# Patient Record
Sex: Female | Born: 1980 | Race: Black or African American | Hispanic: No | Marital: Single | Smoking: Current every day smoker
Health system: Southern US, Community
[De-identification: ages and names within clinical notes are randomized; demographics above are authoritative.]

---

## 2018-08-09 ENCOUNTER — Ambulatory Visit (INDEPENDENT_AMBULATORY_CARE_PROVIDER_SITE_OTHER): Payer: Self-pay | Admitting: Otolaryngology

## 2018-09-10 ENCOUNTER — Ambulatory Visit (INDEPENDENT_AMBULATORY_CARE_PROVIDER_SITE_OTHER): Payer: Self-pay | Admitting: Otolaryngology

## 2019-11-05 ENCOUNTER — Emergency Department (HOSPITAL_COMMUNITY)
Admission: EM | Admit: 2019-11-05 | Discharge: 2019-11-05 | Disposition: A | Discharge status: Home / Self Care | Payer: Medicaid Other | Attending: Emergency Medicine | Admitting: Emergency Medicine

## 2019-11-05 ENCOUNTER — Encounter (HOSPITAL_COMMUNITY): Payer: Self-pay | Admitting: Emergency Medicine

## 2019-11-05 DIAGNOSIS — Y939 Activity, unspecified: Secondary | ICD-10-CM | POA: Diagnosis not present

## 2019-11-05 DIAGNOSIS — W01110A Fall on same level from slipping, tripping and stumbling with subsequent striking against sharp glass, initial encounter: Secondary | ICD-10-CM | POA: Insufficient documentation

## 2019-11-05 DIAGNOSIS — Z5321 Procedure and treatment not carried out due to patient leaving prior to being seen by health care provider: Secondary | ICD-10-CM | POA: Insufficient documentation

## 2019-11-05 DIAGNOSIS — Y929 Unspecified place or not applicable: Secondary | ICD-10-CM | POA: Insufficient documentation

## 2019-11-05 DIAGNOSIS — Y999 Unspecified external cause status: Secondary | ICD-10-CM | POA: Diagnosis not present

## 2019-11-05 DIAGNOSIS — S61511A Laceration without foreign body of right wrist, initial encounter: Secondary | ICD-10-CM | POA: Insufficient documentation

## 2019-11-05 DIAGNOSIS — S6991XA Unspecified injury of right wrist, hand and finger(s), initial encounter: Secondary | ICD-10-CM | POA: Diagnosis present
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
## 2020-01-21 ENCOUNTER — Emergency Department (HOSPITAL_COMMUNITY)
Admission: EM | Admit: 2020-01-21 | Discharge: 2020-01-22 | Disposition: A | Discharge status: Home / Self Care | Payer: Medicaid Other | Attending: Emergency Medicine | Admitting: Emergency Medicine

## 2020-01-21 ENCOUNTER — Emergency Department (HOSPITAL_COMMUNITY): Payer: Medicaid Other

## 2020-01-21 DIAGNOSIS — S01511A Laceration without foreign body of lip, initial encounter: Secondary | ICD-10-CM | POA: Diagnosis present

## 2020-01-21 DIAGNOSIS — M25512 Pain in left shoulder: Secondary | ICD-10-CM | POA: Diagnosis not present

## 2020-01-21 DIAGNOSIS — F17210 Nicotine dependence, cigarettes, uncomplicated: Secondary | ICD-10-CM | POA: Diagnosis not present

## 2020-01-21 DIAGNOSIS — Z20822 Contact with and (suspected) exposure to covid-19: Secondary | ICD-10-CM | POA: Insufficient documentation

## 2020-01-21 DIAGNOSIS — Z23 Encounter for immunization: Secondary | ICD-10-CM | POA: Insufficient documentation

## 2020-01-21 DIAGNOSIS — S92511A Displaced fracture of proximal phalanx of right lesser toe(s), initial encounter for closed fracture: Secondary | ICD-10-CM | POA: Diagnosis not present

## 2020-01-21 DIAGNOSIS — M25562 Pain in left knee: Secondary | ICD-10-CM | POA: Insufficient documentation

## 2020-01-21 DIAGNOSIS — S43402A Unspecified sprain of left shoulder joint, initial encounter: Secondary | ICD-10-CM

## 2020-01-21 DIAGNOSIS — R5081 Fever presenting with conditions classified elsewhere: Secondary | ICD-10-CM | POA: Diagnosis not present

## 2020-01-21 DIAGNOSIS — R509 Fever, unspecified: Secondary | ICD-10-CM

## 2020-01-21 MED ORDER — AMOXICILLIN-POT CLAVULANATE 875-125 MG PO TABS
1.0000 | ORAL_TABLET | Freq: Once | ORAL | Status: AC
  Administered 2020-01-21: 1 via ORAL
  Filled 2020-01-21: qty 1

## 2020-01-21 MED ORDER — ACETAMINOPHEN 500 MG PO TABS
1000.0000 mg | ORAL_TABLET | Freq: Once | ORAL | Status: AC
  Administered 2020-01-21: 1000 mg via ORAL
  Filled 2020-01-21: qty 2

## 2020-01-21 MED ORDER — AMOXICILLIN-POT CLAVULANATE 875-125 MG PO TABS: 1.0000 | ORAL_TABLET | Freq: Two times a day (BID) | ORAL | 0 refills | Status: AC

## 2020-01-21 MED ORDER — ACETAMINOPHEN 500 MG PO TABS
1000.0000 mg | ORAL_TABLET | Freq: Once | ORAL | Status: DC
  Filled 2020-01-21: qty 2

## 2020-01-21 MED ORDER — TETANUS-DIPHTH-ACELL PERTUSSIS 5-2.5-18.5 LF-MCG/0.5 IM SUSY
0.5000 mL | PREFILLED_SYRINGE | Freq: Once | INTRAMUSCULAR | Status: AC
  Administered 2020-01-21: 0.5 mL via INTRAMUSCULAR
  Filled 2020-01-21: qty 0.5

## 2020-01-21 MED ORDER — LIDOCAINE-EPINEPHRINE 1 %-1:100000 IJ SOLN
20.0000 mL | Freq: Once | INTRAMUSCULAR | Status: AC
  Administered 2020-01-21: 1 mL
  Filled 2020-01-21: qty 1

## 2020-01-21 NOTE — Discharge Instructions (Addendum)
It was our pleasure to provide your ER care today - we hope that you feel better.  Take antibiotic as prescribed (augmentin). Take acetaminophen or ibuprofen as need.  Your covid/flu test result is pending and should be back in 1-2 hours - you may call for result or check my Chart.   Use great care to avoid biting lip, and/or opening mouth widely or forcefully (as it could cause sutures to tear through, and/or wound to open up).  Consider liquid/soft diet for the next few days.   Have external (I.e. blue or prolene sutures)  removed by your doctor or urgent care in one week.   You may follow up with plastic surgeon if concern of wound healing and/or appearance - call office to arrange appointment.   For toe fracture, wear comfortable, supportive shoe. You may wear shoulder sling for comfort/support. Follow up with orthopedist in 1-2 weeks.   Return to ER if worse, new symptoms, new or worsening symptoms related to your fever, new or severe pain, abdominal pain, vomiting, trouble breathing, infection of wound, or other concern.
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
## 2020-01-22 ENCOUNTER — Emergency Department (HOSPITAL_COMMUNITY): Payer: Medicaid Other

## 2020-01-22 LAB — RESPIRATORY PANEL BY RT PCR (FLU A&B, COVID)
Influenza A by PCR: NEGATIVE
Influenza B by PCR: NEGATIVE
SARS Coronavirus 2 by RT PCR: POSITIVE — AB
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
## 2020-01-22 NOTE — ED Notes (Signed)
X-ray at bedside

## 2020-01-23 ENCOUNTER — Telehealth: Payer: Self-pay | Admitting: Nurse Practitioner

## 2020-01-23 ENCOUNTER — Telehealth (HOSPITAL_COMMUNITY): Payer: Self-pay

## 2020-01-23 DIAGNOSIS — U071 COVID-19: Secondary | ICD-10-CM

## 2020-01-23 NOTE — Telephone Encounter (Signed)
Called to Discuss with patient about Covid symptoms and the use of a monoclonal antibody infusion for those with mild to moderate Covid symptoms and at a high risk of hospitalization.     Pt is qualified for this infusion at the [facility] center due to co-morbid conditions and/or a member of an at-risk group.     Unable to reach pt. Invalid numbers. No mychart.   Consuello Masse, DNP, AGNP-C [phone] (Infusion Center Hotline)
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
